# Patient Record
Sex: Male | Born: 1970 | Race: White | Hispanic: No | Marital: Married | Smoking: Former smoker
Health system: Southern US, Community
[De-identification: ages and names within clinical notes are randomized; demographics above are authoritative.]

## PROBLEM LIST (undated history)

## (undated) DIAGNOSIS — R002 Palpitations: Secondary | ICD-10-CM

## (undated) DIAGNOSIS — K219 Gastro-esophageal reflux disease without esophagitis: Secondary | ICD-10-CM

## (undated) DIAGNOSIS — Z9989 Dependence on other enabling machines and devices: Secondary | ICD-10-CM

## (undated) DIAGNOSIS — G4733 Obstructive sleep apnea (adult) (pediatric): Secondary | ICD-10-CM

## (undated) DIAGNOSIS — L729 Follicular cyst of the skin and subcutaneous tissue, unspecified: Secondary | ICD-10-CM

## (undated) DIAGNOSIS — F419 Anxiety disorder, unspecified: Secondary | ICD-10-CM

## (undated) DIAGNOSIS — D179 Benign lipomatous neoplasm, unspecified: Secondary | ICD-10-CM

## (undated) HISTORY — DX: Benign lipomatous neoplasm, unspecified: D17.9

## (undated) HISTORY — PX: LIPOMA EXCISION: SHX5283

## (undated) HISTORY — DX: Follicular cyst of the skin and subcutaneous tissue, unspecified: L72.9

## (undated) HISTORY — PX: HERNIA REPAIR: SHX51

## (undated) HISTORY — DX: Gastro-esophageal reflux disease without esophagitis: K21.9

## (undated) HISTORY — DX: Anxiety disorder, unspecified: F41.9

## (undated) HISTORY — DX: Dependence on other enabling machines and devices: Z99.89

## (undated) HISTORY — DX: Obstructive sleep apnea (adult) (pediatric): G47.33

## (undated) HISTORY — DX: Palpitations: R00.2

---

## 1998-10-16 ENCOUNTER — Ambulatory Visit (HOSPITAL_COMMUNITY): Admission: RE | Admit: 1998-10-16 | Discharge: 1998-10-16 | Payer: Self-pay | Admitting: Internal Medicine

## 1998-10-16 ENCOUNTER — Encounter: Payer: Self-pay | Admitting: Internal Medicine

## 2004-04-04 ENCOUNTER — Ambulatory Visit (HOSPITAL_BASED_OUTPATIENT_CLINIC_OR_DEPARTMENT_OTHER): Admission: RE | Admit: 2004-04-04 | Discharge: 2004-04-04 | Payer: Self-pay | Admitting: General Surgery

## 2004-04-04 ENCOUNTER — Ambulatory Visit (HOSPITAL_COMMUNITY): Admission: RE | Admit: 2004-04-04 | Discharge: 2004-04-04 | Payer: Self-pay | Admitting: General Surgery

## 2004-04-04 ENCOUNTER — Encounter (INDEPENDENT_AMBULATORY_CARE_PROVIDER_SITE_OTHER): Payer: Self-pay | Admitting: *Deleted

## 2009-01-27 HISTORY — PX: APPENDECTOMY: SHX54

## 2009-04-06 ENCOUNTER — Encounter: Admission: RE | Admit: 2009-04-06 | Discharge: 2009-04-06 | Payer: Self-pay | Admitting: Gastroenterology

## 2009-04-12 ENCOUNTER — Ambulatory Visit (HOSPITAL_COMMUNITY): Admission: RE | Admit: 2009-04-12 | Discharge: 2009-04-12 | Payer: Self-pay | Admitting: Gastroenterology

## 2009-08-20 ENCOUNTER — Ambulatory Visit (HOSPITAL_COMMUNITY): Admission: AD | Admit: 2009-08-20 | Discharge: 2009-08-21 | Payer: Self-pay | Admitting: Internal Medicine

## 2009-08-20 ENCOUNTER — Encounter (INDEPENDENT_AMBULATORY_CARE_PROVIDER_SITE_OTHER): Payer: Self-pay | Admitting: General Surgery

## 2009-08-20 ENCOUNTER — Encounter: Admission: RE | Admit: 2009-08-20 | Discharge: 2009-08-20 | Payer: Self-pay | Admitting: Internal Medicine

## 2010-04-25 ENCOUNTER — Ambulatory Visit
Admission: RE | Admit: 2010-04-25 | Discharge: 2010-04-25 | Disposition: A | Discharge status: Home / Self Care | Payer: BC Managed Care – PPO | Source: Ambulatory Visit | Attending: Internal Medicine | Admitting: Internal Medicine

## 2010-04-25 ENCOUNTER — Other Ambulatory Visit: Payer: Self-pay | Admitting: Internal Medicine

## 2010-04-25 DIAGNOSIS — N5089 Other specified disorders of the male genital organs: Secondary | ICD-10-CM

## 2010-06-14 NOTE — Op Note (Signed)
NAME:  Douglas Evans, Douglas Evans             ACCOUNT NO.:  [id number]   MEDICAL RECORD NO.:  [id number]          PATIENT TYPE:  AMB   LOCATION:  DSC                          FACILITY:  [facility]   PHYSICIAN:  Gabrielle Dare. [name], M.D.DATE OF BIRTH:  1971-01-13   DATE OF PROCEDURE:  04/04/2004  DATE OF DISCHARGE:                                 OPERATIVE REPORT   PREOPERATIVE DIAGNOSIS:  1.  Left inguinal hernia.  2.  Mass left forearm.   HISTORY OF PRESENT ILLNESS:  The patient is a 40 year old white male who had  been having pain and swelling in his left groin for a month or so.  He was  noted on physical exam for [employer] to have a hernia and he  was then seen by his primary care doctor and referred to me to evaluate both  his left inguinal hernia and a soft tissue mass in his left forearm.  The  patient presents today for elective repair of left inguinal hernia with mesh  and excision of a soft tissue mass in his left forearm.   PROCEDURE IN DETAIL:  Informed consent was obtained.  The patient was  identified. The sites were marked. He received intravenous antibiotics.  He  was brought to the operating room. General anesthesia was administered. His  left groin and lower abdomen and left arm were prepped and draped in sterile  fashion. Attention was first directed to his hernia repair. Marcaine 0.25%  with epinephrine was injected along the site of the planned left groin  incision.  Incision was made in the skin, subcutaneous tissues were  dissected down. Bovie cautery was used for good hemostasis. The external  oblique fascia was exposed and it was noted to be somewhat attenuated.  This  was divided and the division was carried down through the external ring. The  inferior leaflet was dissected bluntly off of the cord structures revealing  the shelving edge of inguinal ligament.  Superior leaflet was dissected off  the underlying transversalis away from the cord structures. The  ilioinguinal  nerve was protected.  Subsequently the cord structures were encircled with  Penrose drain and further dissection revealed this hernia to be a large  direct variety.  This was freed up from surrounding tissues and reduced  easily into the abdomen. The sac was not entered. The cord was then  dissected near the internal ring and there was no indirect hernia sac, only  a small cord lipoma there.  Subsequently the direct hernia sac was reduced  and two interrupted 2-0 Vicryl sutures were placed between the shelving edge  of inguinal ligament and transversalis to keep the direct hernia reduced  while the repair was completed with mesh. Subsequently the definitive hernia  repair was accomplished.  A polypropylene mesh was fashioned into a keyhole  using the template and this keyhole mesh was sutured medially to the tissues  over the pubic tubercle with 0 Prolene suture and it was then sutured in a  running fashion along the shelving edge of the inguinal ligament inferiorly  with 0 Prolene suture.  Subsequently the  superior edge of mesh was tacked  again to the tissues over the pubic tubercle with 0 Prolene suture and it  was sutured in an interrupted fashion with 0 Prolene superiorly along the  transversalis.  Once this was accomplished, the two  leaflets of the mesh  were rejoined to reconstruct the patient's ring behind the cord structures,  again 0 Prolene was used and these leaflets were rejoined and tacked to the  underlying musculature.  Some additional interrupted 3-0 Prolene sutures  were placed to tack the mesh down nicely. The aperture in the keyhole mesh  admitted the tip of the fifth digit along with the cord structures.  Cord  structures remained viable & nonedematous.  The area was copiously  irrigated. Meticulous hemostasis was obtained. The external oblique fascia  was then closed with running 3-0 Vicryl suture. Some additional local  anesthetic was injected,  including injection out towards the superior iliac  spine for postoperative pain relief and then Scarpa's fascia was  reapproximated with a series of interrupted 3-0 Vicryl sutures. The  subcutaneous tissues were  again irrigated. The skin was closed with a  running 4-0 Monocryl subcuticular stitch. Sponge, needle and instrument  counts were correct. Benzoin, Steri-Strips and sterile dressing were  applied. Attention was then directed to the patient's left forearm.  Some  0.25% plain Marcaine was injected for local anesthetic. A longitudinal  incision was made along the length of this 2 cm mass.  Subcutaneous tissues  were dissected down revealing a well encapsulated mass. This was removed in  one piece using Bovie cautery circumferentially.  There was an additional  globular piece of fat next to this mass which was also sent with specimen.  This was very small.  The tissues were then irrigated.  Subcutaneous tissues  were reapproximated with a series of interrupted 3-0 Vicryl sutures and the  skin was closed with running 4-0 Monocryl subcuticular stitch. After  meticulous hemostasis was obtained, Benzoin, Steri-Strips and sterile  dressing were applied. Sponge, needle and instrument counts were again  correct. The patient tolerated the procedure well and was taken to the  recovery room confirm stable condition.  There were no apparent  complications.      BET/MEDQ  D:  04/04/2004  T:  04/04/2004  Job:  [id number]

## 2014-06-21 ENCOUNTER — Encounter: Payer: Self-pay | Admitting: *Deleted

## 2014-09-14 ENCOUNTER — Encounter (HOSPITAL_COMMUNITY): Payer: Self-pay | Admitting: *Deleted

## 2014-09-14 ENCOUNTER — Other Ambulatory Visit: Payer: Self-pay | Admitting: Gastroenterology

## 2014-09-15 ENCOUNTER — Encounter (HOSPITAL_COMMUNITY): Payer: Self-pay | Admitting: *Deleted

## 2014-09-18 ENCOUNTER — Ambulatory Visit (HOSPITAL_COMMUNITY): Payer: BLUE CROSS/BLUE SHIELD | Admitting: Anesthesiology

## 2014-09-18 ENCOUNTER — Encounter (HOSPITAL_COMMUNITY): Payer: Self-pay

## 2014-09-18 ENCOUNTER — Ambulatory Visit (HOSPITAL_COMMUNITY)
Admission: RE | Admit: 2014-09-18 | Discharge: 2014-09-18 | Disposition: A | Discharge status: Home / Self Care | Payer: BLUE CROSS/BLUE SHIELD | Source: Ambulatory Visit | Attending: Gastroenterology | Admitting: Gastroenterology

## 2014-09-18 ENCOUNTER — Encounter (HOSPITAL_COMMUNITY)
Admission: RE | Disposition: A | Discharge status: Home / Self Care | Payer: Self-pay | Source: Ambulatory Visit | Attending: Gastroenterology

## 2014-09-18 DIAGNOSIS — R109 Unspecified abdominal pain: Secondary | ICD-10-CM | POA: Diagnosis not present

## 2014-09-18 DIAGNOSIS — K222 Esophageal obstruction: Secondary | ICD-10-CM | POA: Diagnosis not present

## 2014-09-18 DIAGNOSIS — K269 Duodenal ulcer, unspecified as acute or chronic, without hemorrhage or perforation: Secondary | ICD-10-CM | POA: Insufficient documentation

## 2014-09-18 DIAGNOSIS — G4733 Obstructive sleep apnea (adult) (pediatric): Secondary | ICD-10-CM | POA: Insufficient documentation

## 2014-09-18 DIAGNOSIS — R1310 Dysphagia, unspecified: Secondary | ICD-10-CM | POA: Insufficient documentation

## 2014-09-18 DIAGNOSIS — Z79899 Other long term (current) drug therapy: Secondary | ICD-10-CM | POA: Diagnosis not present

## 2014-09-18 DIAGNOSIS — K449 Diaphragmatic hernia without obstruction or gangrene: Secondary | ICD-10-CM | POA: Diagnosis not present

## 2014-09-18 DIAGNOSIS — K219 Gastro-esophageal reflux disease without esophagitis: Secondary | ICD-10-CM | POA: Insufficient documentation

## 2014-09-18 DIAGNOSIS — G4730 Sleep apnea, unspecified: Secondary | ICD-10-CM | POA: Diagnosis not present

## 2014-09-18 DIAGNOSIS — R197 Diarrhea, unspecified: Secondary | ICD-10-CM | POA: Diagnosis not present

## 2014-09-18 DIAGNOSIS — F419 Anxiety disorder, unspecified: Secondary | ICD-10-CM | POA: Diagnosis not present

## 2014-09-18 HISTORY — PX: BALLOON DILATION: SHX5330

## 2014-09-18 HISTORY — PX: COLONOSCOPY WITH PROPOFOL: SHX5780

## 2014-09-18 HISTORY — PX: ESOPHAGOGASTRODUODENOSCOPY (EGD) WITH PROPOFOL: SHX5813

## 2014-09-18 SURGERY — ESOPHAGOGASTRODUODENOSCOPY (EGD) WITH PROPOFOL
Anesthesia: Monitor Anesthesia Care

## 2014-09-18 MED ORDER — PROPOFOL 10 MG/ML IV BOLUS
INTRAVENOUS | Status: AC
  Filled 2014-09-18: qty 20

## 2014-09-18 MED ORDER — BUTAMBEN-TETRACAINE-BENZOCAINE 2-2-14 % EX AERO
INHALATION_SPRAY | CUTANEOUS | Status: DC | PRN
  Administered 2014-09-18: 1 via TOPICAL

## 2014-09-18 MED ORDER — LACTATED RINGERS IV SOLN
INTRAVENOUS | Status: DC
  Administered 2014-09-18: 1000 mL via INTRAVENOUS

## 2014-09-18 MED ORDER — SODIUM CHLORIDE 0.9 % IV SOLN: INTRAVENOUS | Status: DC

## 2014-09-18 MED ORDER — PROPOFOL 10 MG/ML IV BOLUS
INTRAVENOUS | Status: DC | PRN
  Administered 2014-09-18 (×2): 10 mg via INTRAVENOUS
  Administered 2014-09-18: 20 mg via INTRAVENOUS
  Administered 2014-09-18 (×3): 10 mg via INTRAVENOUS
  Administered 2014-09-18: 20 mg via INTRAVENOUS
  Administered 2014-09-18 (×4): 10 mg via INTRAVENOUS
  Administered 2014-09-18: 30 mg via INTRAVENOUS
  Administered 2014-09-18: 20 mg via INTRAVENOUS
  Administered 2014-09-18 (×5): 10 mg via INTRAVENOUS
  Administered 2014-09-18: 40 mg via INTRAVENOUS
  Administered 2014-09-18 (×2): 20 mg via INTRAVENOUS
  Administered 2014-09-18 (×4): 10 mg via INTRAVENOUS

## 2014-09-18 SURGICAL SUPPLY — 25 items

## 2014-09-18 NOTE — Discharge Instructions (Signed)
Colonoscopy, Care After °These instructions give you information on caring for yourself after your procedure. Your doctor may also give you more specific instructions. Call your doctor if you have any problems or questions after your procedure. °HOME CARE °· Do not drive for 24 hours. °· Do not sign important papers or use machinery for 24 hours. °· You may shower. °· You may go back to your usual activities, but go slower for the first 24 hours. °· Take rest breaks often during the first 24 hours. °· Walk around or use warm packs on your belly (abdomen) if you have belly cramping or gas. °· Drink enough fluids to keep your pee (urine) clear or pale yellow. °· Resume your normal diet. Avoid heavy or fried foods. °· Avoid drinking alcohol for 24 hours or as told by your doctor. °· Only take medicines as told by your doctor. °If a tissue sample (biopsy) was taken during the procedure:  °· Do not take aspirin or blood thinners for 7 days, or as told by your doctor. °· Do not drink alcohol for 7 days, or as told by your doctor. °· Eat soft foods for the first 24 hours. °GET HELP IF: °You still have a small amount of blood in your poop (stool) 2-3 days after the procedure. °GET HELP RIGHT AWAY IF: °· You have more than a small amount of blood in your poop. °· You see clumps of tissue (blood clots) in your poop. °· Your belly is puffy (swollen). °· You feel sick to your stomach (nauseous) or throw up (vomit). °· You have a fever. °· You have belly pain that gets worse and medicine does not help. °MAKE SURE YOU: °· Understand these instructions. °· Will watch your condition. °· Will get help right away if you are not doing well or get worse. °Document Released: 02/15/2010 Document Revised: 01/18/2013 Document Reviewed: 09/20/2012 °[employer]® Patient Information ©2015 [employer]. This information is not intended to replace advice given to you by your health care provider. Make sure you discuss any questions you have with  your health care provider. °Esophagogastroduodenoscopy °Care After °Refer to this sheet in the next few weeks. These instructions provide you with information on caring for yourself after your procedure. Your caregiver may also give you more specific instructions. Your treatment has been planned according to current medical practices, but problems sometimes occur. Call your caregiver if you have any problems or questions after your procedure.  °HOME CARE INSTRUCTIONS °· Do not eat or drink anything until the numbing medicine (local anesthetic) has worn off and your gag reflex has returned. You will know that the local anesthetic has worn off when you can swallow comfortably. °· Do not drive for 12 hours after the procedure or as directed by your caregiver. °· Only take medicines as directed by your caregiver. °SEEK MEDICAL CARE IF:  °· You cannot stop coughing. °· You are not urinating at all or less than usual. °SEEK IMMEDIATE MEDICAL CARE IF: °· You have difficulty swallowing. °· You cannot eat or drink. °· You have worsening throat or chest pain. °· You have dizziness, lightheadedness, or you faint. °· You have nausea or vomiting. °· You have chills. °· You have a fever. °· You have severe abdominal pain. °· You have black, tarry, or bloody stools. °Document Released: 12/31/2011 Document Reviewed: 12/31/2011 °[employer]® Patient Information ©2015 [employer]. This information is not intended to replace advice given to you by your health care provider. Make sure you   discuss any questions you have with your health care provider. ° °

## 2014-09-18 NOTE — Anesthesia Postprocedure Evaluation (Signed)
  Anesthesia Post-op Note  Patient: Douglas Evans [facility]  Procedure(s) Performed: Procedure(s) (LRB): ESOPHAGOGASTRODUODENOSCOPY (EGD) WITH PROPOFOL (N/A) COLONOSCOPY WITH PROPOFOL (N/A) BALLOON DILATION (N/A)  Patient Location: PACU  Anesthesia Type: MAC  Level of Consciousness: awake and alert   Airway and Oxygen Therapy: Patient Spontanous Breathing  Post-op Pain: mild  Post-op Assessment: Post-op Vital signs reviewed, Patient's Cardiovascular Status Stable, Respiratory Function Stable, Patent Airway and No signs of Nausea or vomiting  Last Vitals:  Filed Vitals:   09/18/14 0820  BP: 106/79  Pulse: 62  Temp:   Resp: 13    Post-op Vital Signs: stable   Complications: No apparent anesthesia complications

## 2014-09-18 NOTE — H&P (Signed)
  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 Chronic postprandial watery diarrhea  History: The patient is a 44 year old male born 1971/01/10. 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 He has redeveloped esophageal dysphagia. He is taking omeprazole each morning.  His daughter was recently diagnosed with Crohn's disease. The patient takes Lexapro on a daily basis. He has chronic postprandial abdominal cramps leading to explosive watery, nonbloody diarrhea unassociated with anorexia, weight loss, or nocturnal diarrhea.  Differential diagnosis would include microscopic colitis associated with SSRI , inflammatory bowel disease, irritable bowel syndrome, and celiac disease.  He is scheduled to undergo diagnostic esophagogastroduodenoscopy with benign esophageal stricture dilation followed by diagnostic colonoscopy.  Past medical history: Chronic anxiety. Obstructive sleep apnea syndrome. 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 Appendectomy. Lipoma removal from the left arm. Left inguinal hernia surgery.  Medication allergies: Penicillin  Exam: The patient is alert and lying comfortably on the endoscopy stretcher. Abdomen is soft and nontender to palpation. Cardiac exam reveals a regular rhythm. Lungs are clear to auscultation.  Plan: Proceed with diagnostic esophagogastroduodenoscopy, benign esophageal stricture dilation, and diagnostic colonoscopy

## 2014-09-18 NOTE — Transfer of Care (Signed)
Immediate Anesthesia Transfer of Care Note  Patient: Douglas Evans [facility]  Procedure(s) Performed: Procedure(s) with comments: ESOPHAGOGASTRODUODENOSCOPY (EGD) WITH PROPOFOL (N/A) - With Savory Dilation with Fluoro COLONOSCOPY WITH PROPOFOL (N/A) BALLOON DILATION (N/A)  Patient Location: PACU  Anesthesia Type:MAC  Level of Consciousness: sedated  Airway & Oxygen Therapy: Patient Spontanous Breathing and Patient connected to nasal cannula oxygen  Post-op Assessment: Report given to RN and Post -op Vital signs reviewed and stable  Post vital signs: Reviewed and stable  Last Vitals:  Filed Vitals:   09/18/14 0631  BP: 132/82  Pulse: 61  Temp: 36.7 C  Resp: 19    Complications: No apparent anesthesia complications

## 2014-09-18 NOTE — Op Note (Signed)
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 Chronic postprandial abdominal cramps leading to explosive watery, nonbloody diarrhea unassociated with anorexia, weight loss, or nocturnal diarrhea.  Endoscopist: Earle Gell  Premedication: Propofol administered by anesthesia  Procedure: Diagnostic esophagogastroduodenoscopy with small bowel biopsies The patient was placed in the left lateral decubitus position. The Pentax gastroscope was passed through the posterior hypopharynx into the proximal esophagus without difficulty. The hypopharynx, larynx, and vocal cords appeared normal.  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 The squamocolumnar junction was noted at 40 cm from the incisor teeth.  Gastroscopy: There was a small hiatal hernia. Retroflexed view of the gastric cardia and fundus was normal. The diaphragmatic hiatus was patulous. The gastric body, antrum, and pylorus appeared normal.  Duodenoscopy: Scattered small erosions were present in the distal duodenal bulb and proximal second portion of duodenum. 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 Scattered small erosions were present in the distal duodenal bulb and proximal second portion of the duodenum. Small bowel biopsies were  performed to look for villous atrophy associated with celiac disease  Recommendation: Take omeprazole 20 mg before breakfast each morning to prevent acid gastroesophageal reflux  Procedure: Diagnostic colonoscopy Anal inspection and digital rectal exam were normal. The Pentax pediatric colonoscope was introduced into the rectum and advanced to the cecum. A normal-appearing appendiceal orifice was identified. A normal-appearing ileocecal valve was intubated and the terminal ileum inspected. Colonic preparation for the exam today was good. Withdrawal time was 9 minutes  Rectum. Normal. Retroflexed view of the distal rectum was normal  Avoid colon and descending colon. Normal  Splenic flexure. Normal  Transverse colon. Normal  Hepatic flexure. Normal  Ascending colon. Normal  Cecum and ileocecal valve. Normal  Terminal ileum. Normal  Random colon biopsies. Biopsies were performed from the right colon and left colon to look for microscopic colitis  Assessment: Normal diagnostic colonoscopy. Small bowel biopsies to rule out microscopic colitis pending.

## 2014-09-18 NOTE — Anesthesia Preprocedure Evaluation (Addendum)
Anesthesia Evaluation  Patient identified by MRN, date of birth, ID band Patient awake    Reviewed: Allergy & Precautions, NPO status , Patient's Chart, lab work & pertinent test results  Airway Mallampati: II  TM Distance: >3 FB Neck ROM: Full    Dental no notable dental hx.    Pulmonary neg pulmonary ROS, sleep apnea and Continuous Positive Airway Pressure Ventilation ,  breath sounds clear to auscultation  Pulmonary exam normal       Cardiovascular negative cardio ROS Normal cardiovascular examRhythm:Regular Rate:Normal     Neuro/Psych negative neurological ROS  negative psych ROS   GI/Hepatic negative GI ROS, Neg liver ROS,   Endo/Other  negative endocrine ROS  Renal/GU negative Renal ROS  negative genitourinary   Musculoskeletal negative musculoskeletal ROS (+)   Abdominal   Peds negative pediatric ROS (+)  Hematology negative hematology ROS (+)   Anesthesia Other Findings   Reproductive/Obstetrics negative OB ROS                            Anesthesia Physical Anesthesia Plan  ASA: II  Anesthesia Plan: MAC   Post-op Pain Management:    Induction: Intravenous  Airway Management Planned:   Additional Equipment:   Intra-op Plan:   Post-operative Plan: Extubation in OR  Informed Consent: I have reviewed the patients History and Physical, chart, labs and discussed the procedure including the risks, benefits and alternatives for the proposed anesthesia with the patient or authorized representative who has indicated his/her understanding and acceptance.   Dental advisory given  Plan Discussed with: CRNA  Anesthesia Plan Comments:         Anesthesia Quick Evaluation

## 2014-09-19 ENCOUNTER — Encounter (HOSPITAL_COMMUNITY): Payer: Self-pay | Admitting: Gastroenterology

## 2016-06-30 ENCOUNTER — Encounter: Payer: Self-pay | Admitting: *Deleted

## 2016-06-30 ENCOUNTER — Emergency Department (INDEPENDENT_AMBULATORY_CARE_PROVIDER_SITE_OTHER): Payer: Self-pay

## 2016-06-30 ENCOUNTER — Emergency Department (INDEPENDENT_AMBULATORY_CARE_PROVIDER_SITE_OTHER)
Admission: EM | Admit: 2016-06-30 | Discharge: 2016-06-30 | Disposition: A | Discharge status: Home / Self Care | Payer: Self-pay | Source: Home / Self Care | Attending: Family Medicine | Admitting: Family Medicine

## 2016-06-30 DIAGNOSIS — R51 Headache: Secondary | ICD-10-CM

## 2016-06-30 DIAGNOSIS — M542 Cervicalgia: Secondary | ICD-10-CM

## 2016-06-30 DIAGNOSIS — S161XXA Strain of muscle, fascia and tendon at neck level, initial encounter: Secondary | ICD-10-CM

## 2016-06-30 MED ORDER — CYCLOBENZAPRINE HCL 10 MG PO TABS: 10.0000 mg | ORAL_TABLET | Freq: Three times a day (TID) | ORAL | 0 refills | Status: AC
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
## 2016-06-30 NOTE — Discharge Instructions (Signed)
Apply ice pack for 20 to 30 minutes, 3 to 4 times daily  Continue until pain and swelling decrease.  May take Ibuprofen 200mg , 4 tabs every 8 hours with food.  Wear cervical collar until neck pain and spasm resolve.  Begin neck exercises as tolerated.

## 2018-03-18 DIAGNOSIS — R0981 Nasal congestion: Secondary | ICD-10-CM | POA: Diagnosis not present

## 2018-03-18 DIAGNOSIS — J069 Acute upper respiratory infection, unspecified: Secondary | ICD-10-CM | POA: Diagnosis not present
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
## 2018-08-27 DIAGNOSIS — R109 Unspecified abdominal pain: Secondary | ICD-10-CM | POA: Diagnosis not present

## 2018-08-27 DIAGNOSIS — R195 Other fecal abnormalities: Secondary | ICD-10-CM | POA: Diagnosis not present

## 2018-10-18 ENCOUNTER — Other Ambulatory Visit: Payer: Self-pay | Admitting: Internal Medicine

## 2018-10-18 ENCOUNTER — Ambulatory Visit
Admission: RE | Admit: 2018-10-18 | Discharge: 2018-10-18 | Disposition: A | Discharge status: Home / Self Care | Payer: BC Managed Care – PPO | Source: Ambulatory Visit | Attending: Internal Medicine | Admitting: Internal Medicine

## 2018-10-18 DIAGNOSIS — M542 Cervicalgia: Secondary | ICD-10-CM

## 2018-10-18 DIAGNOSIS — Z125 Encounter for screening for malignant neoplasm of prostate: Secondary | ICD-10-CM | POA: Diagnosis not present

## 2018-10-18 DIAGNOSIS — Z1389 Encounter for screening for other disorder: Secondary | ICD-10-CM | POA: Diagnosis not present

## 2018-10-18 DIAGNOSIS — Z0000 Encounter for general adult medical examination without abnormal findings: Secondary | ICD-10-CM | POA: Diagnosis not present

## 2018-10-18 DIAGNOSIS — Z23 Encounter for immunization: Secondary | ICD-10-CM | POA: Diagnosis not present

## 2018-10-18 DIAGNOSIS — K219 Gastro-esophageal reflux disease without esophagitis: Secondary | ICD-10-CM | POA: Diagnosis not present

## 2018-10-18 DIAGNOSIS — G4733 Obstructive sleep apnea (adult) (pediatric): Secondary | ICD-10-CM | POA: Diagnosis not present

## 2018-10-18 DIAGNOSIS — Z13220 Encounter for screening for lipoid disorders: Secondary | ICD-10-CM | POA: Diagnosis not present

## 2018-10-18 DIAGNOSIS — Z131 Encounter for screening for diabetes mellitus: Secondary | ICD-10-CM | POA: Diagnosis not present

## 2019-01-25 DIAGNOSIS — G4733 Obstructive sleep apnea (adult) (pediatric): Secondary | ICD-10-CM | POA: Diagnosis not present

## 2019-02-14 DIAGNOSIS — G4733 Obstructive sleep apnea (adult) (pediatric): Secondary | ICD-10-CM | POA: Diagnosis not present

## 2019-04-27 DIAGNOSIS — L259 Unspecified contact dermatitis, unspecified cause: Secondary | ICD-10-CM | POA: Diagnosis not present

## 2019-04-27 DIAGNOSIS — L237 Allergic contact dermatitis due to plants, except food: Secondary | ICD-10-CM | POA: Diagnosis not present

## 2019-06-15 DIAGNOSIS — R0981 Nasal congestion: Secondary | ICD-10-CM | POA: Diagnosis not present

## 2019-06-15 DIAGNOSIS — J309 Allergic rhinitis, unspecified: Secondary | ICD-10-CM | POA: Diagnosis not present

## 2019-07-27 DIAGNOSIS — M545 Low back pain: Secondary | ICD-10-CM | POA: Diagnosis not present

## 2019-10-24 DIAGNOSIS — Z0000 Encounter for general adult medical examination without abnormal findings: Secondary | ICD-10-CM | POA: Diagnosis not present

## 2019-10-24 DIAGNOSIS — R7303 Prediabetes: Secondary | ICD-10-CM | POA: Diagnosis not present

## 2019-10-24 DIAGNOSIS — Z23 Encounter for immunization: Secondary | ICD-10-CM | POA: Diagnosis not present

## 2019-10-24 DIAGNOSIS — Z13220 Encounter for screening for lipoid disorders: Secondary | ICD-10-CM | POA: Diagnosis not present

## 2019-10-24 DIAGNOSIS — J309 Allergic rhinitis, unspecified: Secondary | ICD-10-CM | POA: Diagnosis not present

## 2019-10-24 DIAGNOSIS — G4733 Obstructive sleep apnea (adult) (pediatric): Secondary | ICD-10-CM | POA: Diagnosis not present

## 2019-10-24 DIAGNOSIS — N200 Calculus of kidney: Secondary | ICD-10-CM | POA: Diagnosis not present

## 2020-01-09 DIAGNOSIS — Z20822 Contact with and (suspected) exposure to covid-19: Secondary | ICD-10-CM | POA: Diagnosis not present

## 2020-01-09 DIAGNOSIS — R197 Diarrhea, unspecified: Secondary | ICD-10-CM | POA: Diagnosis not present

## 2020-01-16 DIAGNOSIS — Z20822 Contact with and (suspected) exposure to covid-19: Secondary | ICD-10-CM | POA: Diagnosis not present

## 2020-04-09 DIAGNOSIS — R197 Diarrhea, unspecified: Secondary | ICD-10-CM | POA: Diagnosis not present

## 2020-07-05 DIAGNOSIS — K219 Gastro-esophageal reflux disease without esophagitis: Secondary | ICD-10-CM | POA: Diagnosis not present

## 2020-07-05 DIAGNOSIS — R0789 Other chest pain: Secondary | ICD-10-CM | POA: Diagnosis not present

## 2020-07-05 DIAGNOSIS — Z79899 Other long term (current) drug therapy: Secondary | ICD-10-CM | POA: Diagnosis not present

## 2020-07-05 DIAGNOSIS — Z880 Allergy status to penicillin: Secondary | ICD-10-CM | POA: Diagnosis not present

## 2020-07-05 DIAGNOSIS — R079 Chest pain, unspecified: Secondary | ICD-10-CM | POA: Diagnosis not present

## 2020-07-05 DIAGNOSIS — R9431 Abnormal electrocardiogram [ECG] [EKG]: Secondary | ICD-10-CM | POA: Diagnosis not present

## 2020-10-23 IMAGING — DX DG CERVICAL SPINE 2 OR 3 VIEWS
4 series · 4 of 4 positions shown · non-contrast
Comparison: June 30, 2016

CLINICAL DATA: Cervicalgia

EXAM:
CERVICAL SPINE - 2-3 VIEW

[dg cervical spine 2 or 3 views (1 of 4)]
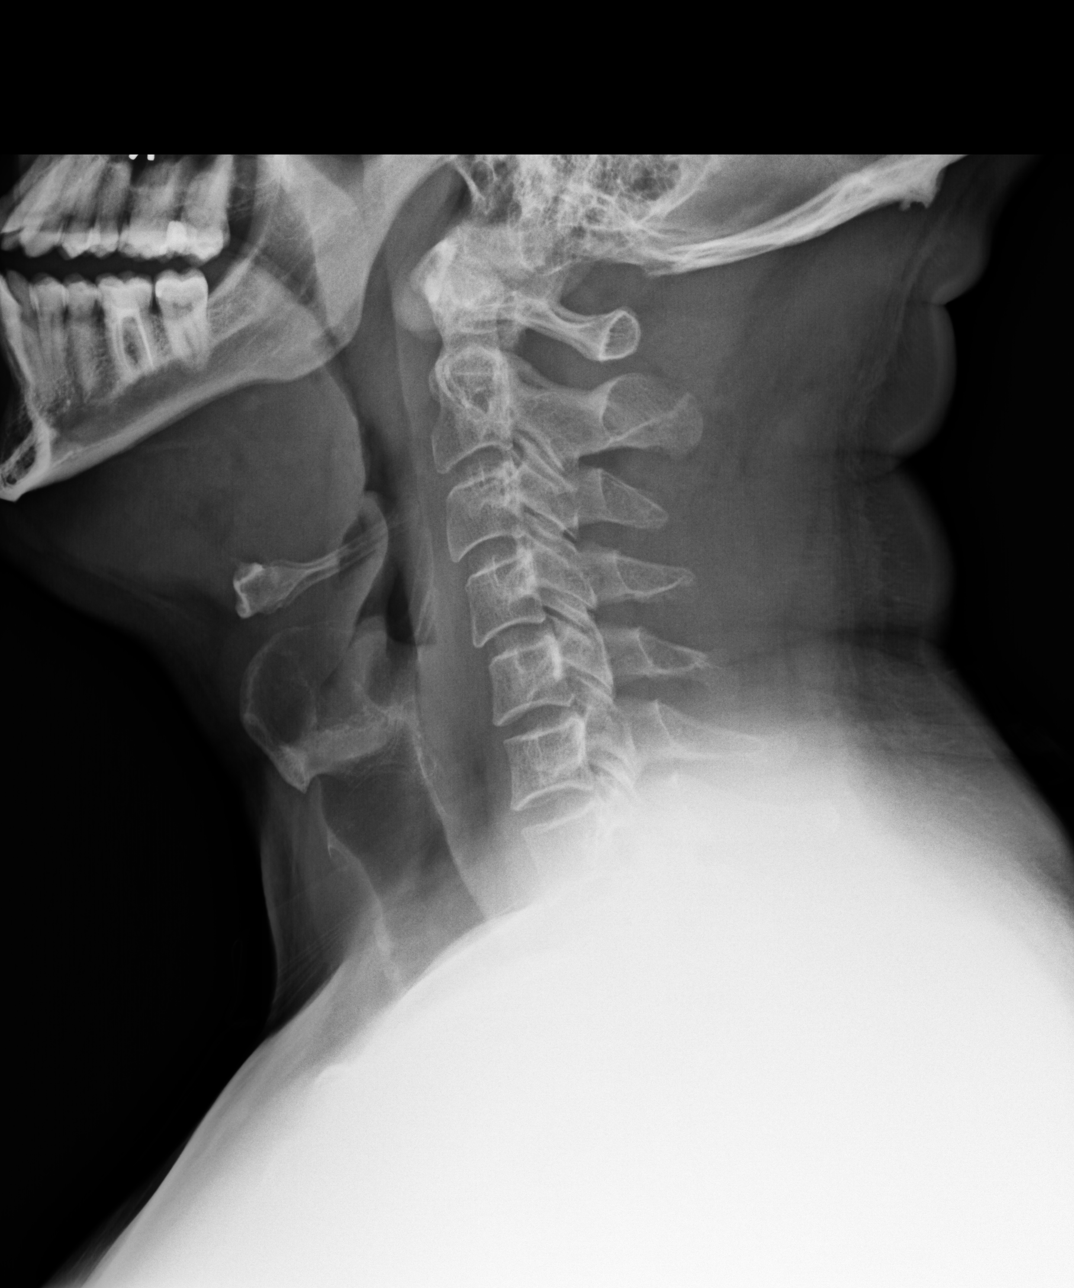

[dg cervical spine 2 or 3 views (2 of 4)]
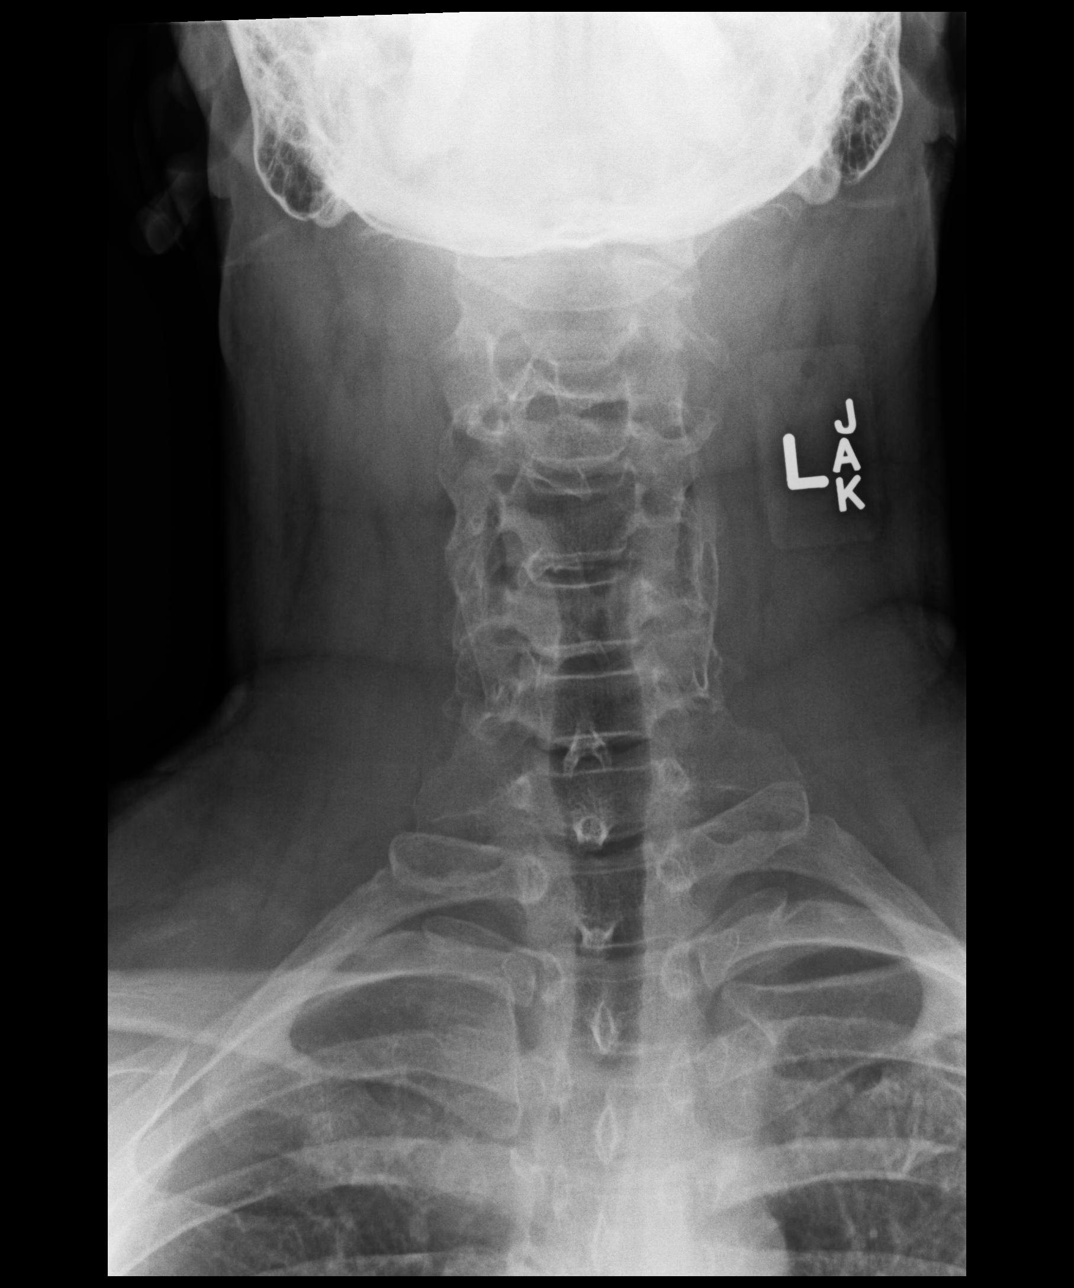

[dg cervical spine 2 or 3 views (3 of 4)]
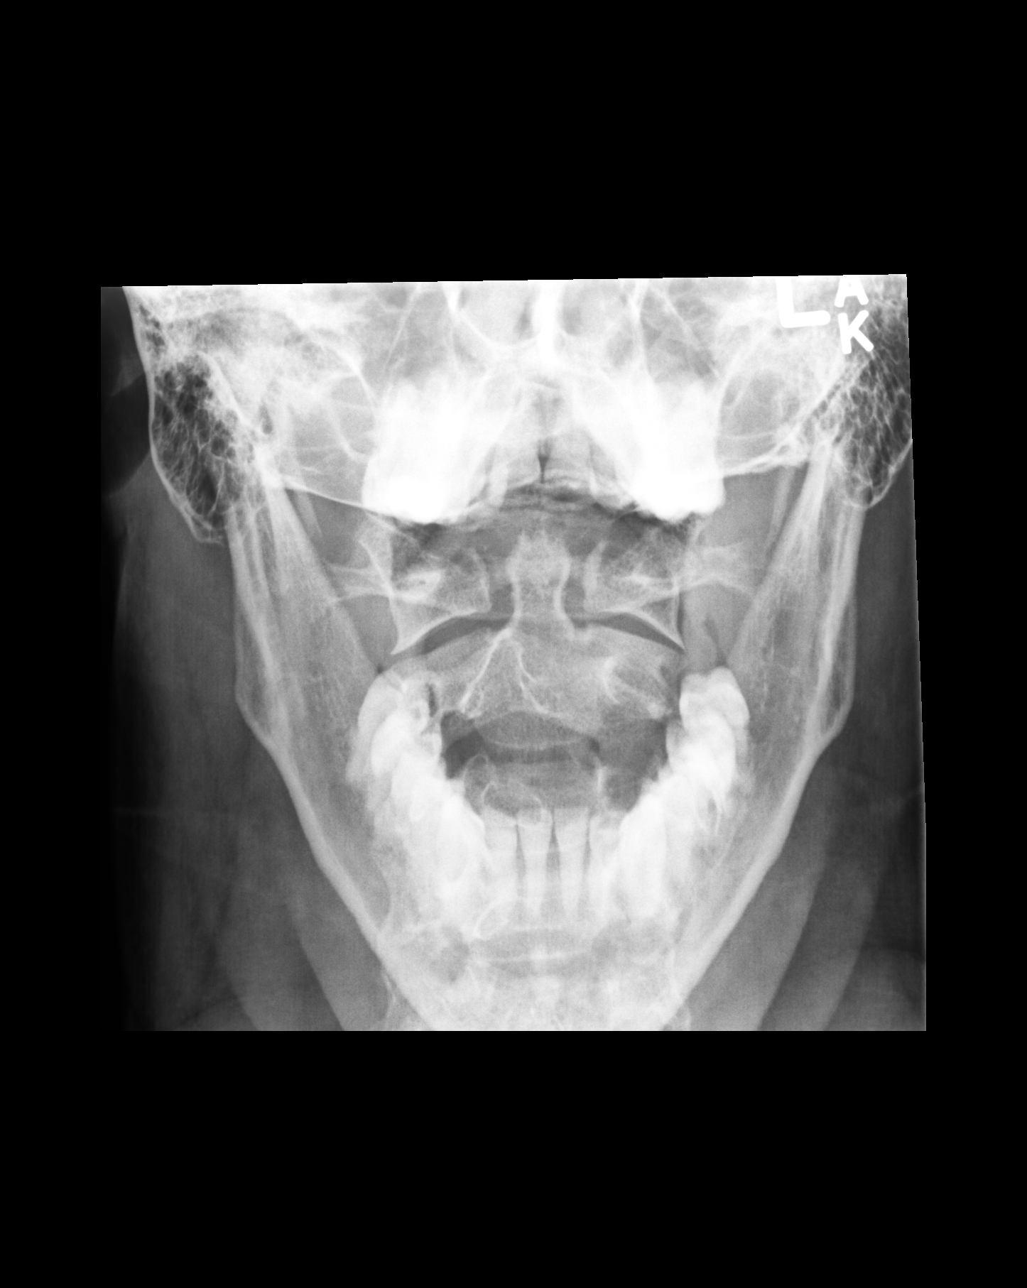

[dg cervical spine 2 or 3 views (4 of 4)]
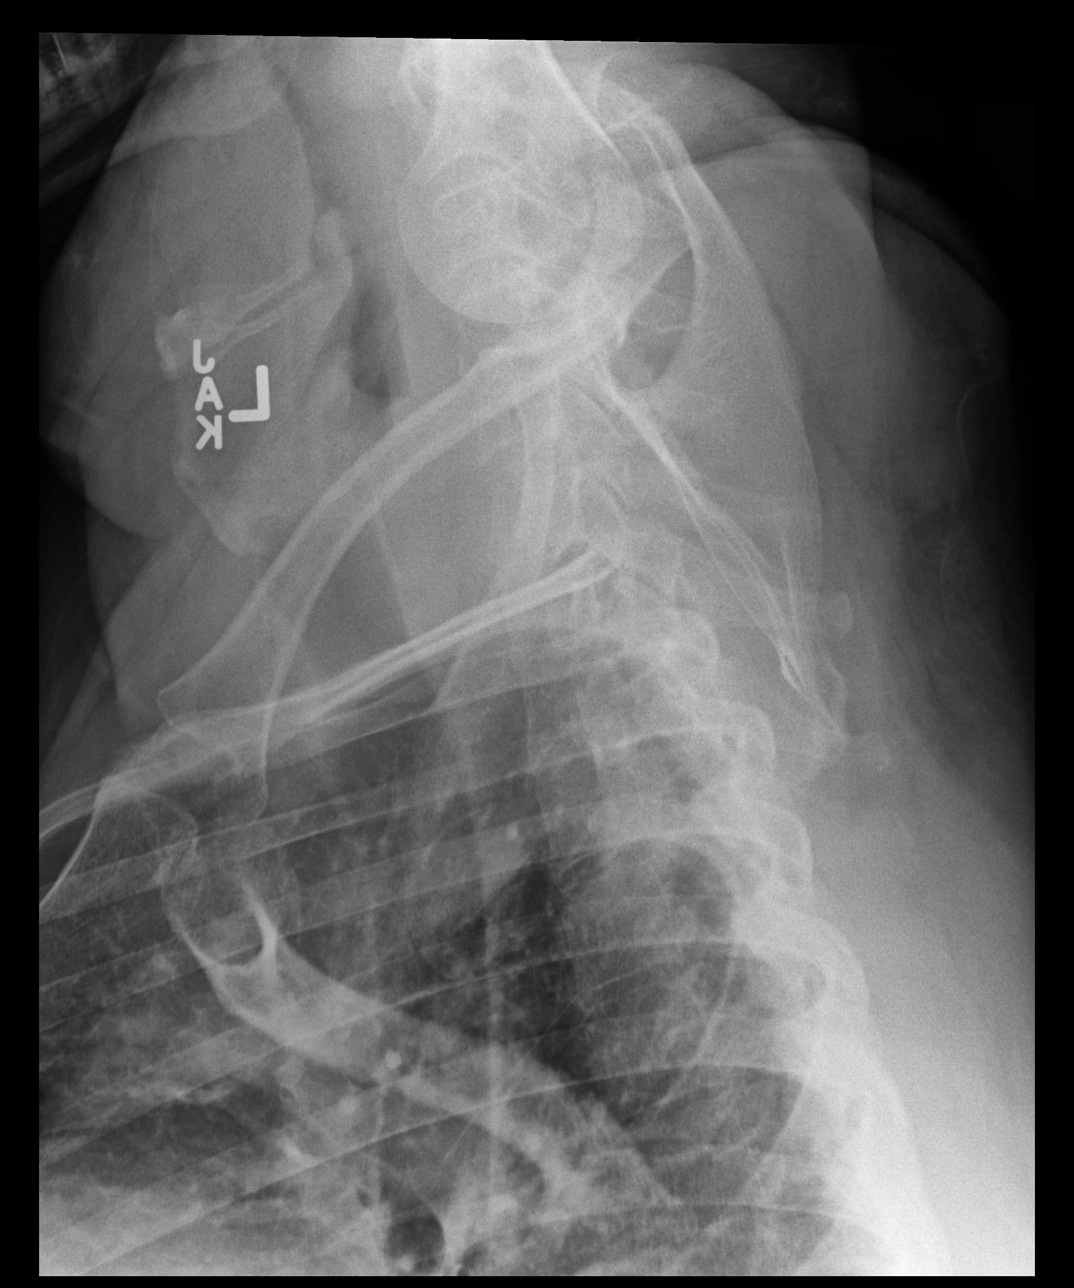

[4 of 4 positions shown; findings below may reference images not displayed]

FINDINGS: Frontal, lateral, and open-mouth odontoid images were obtained.
There is no fracture or spondylolisthesis. Prevertebral soft tissues
and predental space regions are normal. Disc spaces appear
unremarkable. No erosive change. There is mild reversal of lordotic
curvature. Lung apices are clear.
IMPRESSION: Mild reversal of lordotic curvature likely is indicative of a degree
of muscle spasm. No fracture or spondylolisthesis. No appreciable
arthropathy.

## 2020-10-26 DIAGNOSIS — Z0000 Encounter for general adult medical examination without abnormal findings: Secondary | ICD-10-CM | POA: Diagnosis not present

## 2020-10-26 DIAGNOSIS — R7303 Prediabetes: Secondary | ICD-10-CM | POA: Diagnosis not present

## 2020-10-26 DIAGNOSIS — Z125 Encounter for screening for malignant neoplasm of prostate: Secondary | ICD-10-CM | POA: Diagnosis not present

## 2020-10-26 DIAGNOSIS — N400 Enlarged prostate without lower urinary tract symptoms: Secondary | ICD-10-CM | POA: Diagnosis not present

## 2020-10-26 DIAGNOSIS — Z23 Encounter for immunization: Secondary | ICD-10-CM | POA: Diagnosis not present

## 2020-10-26 DIAGNOSIS — Z13220 Encounter for screening for lipoid disorders: Secondary | ICD-10-CM | POA: Diagnosis not present

## 2020-11-28 DIAGNOSIS — G4733 Obstructive sleep apnea (adult) (pediatric): Secondary | ICD-10-CM | POA: Diagnosis not present

## 2021-01-02 DIAGNOSIS — U071 COVID-19: Secondary | ICD-10-CM | POA: Diagnosis not present

## 2021-02-15 DIAGNOSIS — M542 Cervicalgia: Secondary | ICD-10-CM | POA: Diagnosis not present

## 2021-03-01 DIAGNOSIS — M47812 Spondylosis without myelopathy or radiculopathy, cervical region: Secondary | ICD-10-CM | POA: Diagnosis not present

## 2021-03-14 DIAGNOSIS — M47812 Spondylosis without myelopathy or radiculopathy, cervical region: Secondary | ICD-10-CM | POA: Diagnosis not present

## 2021-04-05 DIAGNOSIS — M47812 Spondylosis without myelopathy or radiculopathy, cervical region: Secondary | ICD-10-CM | POA: Diagnosis not present

## 2021-04-21 DIAGNOSIS — R112 Nausea with vomiting, unspecified: Secondary | ICD-10-CM | POA: Diagnosis not present

## 2021-04-21 DIAGNOSIS — R1084 Generalized abdominal pain: Secondary | ICD-10-CM | POA: Diagnosis not present

## 2021-04-21 DIAGNOSIS — K449 Diaphragmatic hernia without obstruction or gangrene: Secondary | ICD-10-CM | POA: Diagnosis not present

## 2021-04-21 DIAGNOSIS — N201 Calculus of ureter: Secondary | ICD-10-CM | POA: Diagnosis not present

## 2021-04-21 DIAGNOSIS — Z880 Allergy status to penicillin: Secondary | ICD-10-CM | POA: Diagnosis not present

## 2021-04-21 DIAGNOSIS — K429 Umbilical hernia without obstruction or gangrene: Secondary | ICD-10-CM | POA: Diagnosis not present

## 2021-04-21 DIAGNOSIS — N132 Hydronephrosis with renal and ureteral calculous obstruction: Secondary | ICD-10-CM | POA: Diagnosis not present

## 2021-04-21 DIAGNOSIS — K769 Liver disease, unspecified: Secondary | ICD-10-CM | POA: Diagnosis not present

## 2021-04-21 DIAGNOSIS — Z79899 Other long term (current) drug therapy: Secondary | ICD-10-CM | POA: Diagnosis not present

## 2021-04-21 DIAGNOSIS — Z87442 Personal history of urinary calculi: Secondary | ICD-10-CM | POA: Diagnosis not present

## 2021-04-21 DIAGNOSIS — R1032 Left lower quadrant pain: Secondary | ICD-10-CM | POA: Diagnosis not present

## 2021-11-01 DIAGNOSIS — Z13220 Encounter for screening for lipoid disorders: Secondary | ICD-10-CM | POA: Diagnosis not present

## 2021-11-01 DIAGNOSIS — N400 Enlarged prostate without lower urinary tract symptoms: Secondary | ICD-10-CM | POA: Diagnosis not present

## 2021-11-01 DIAGNOSIS — Z125 Encounter for screening for malignant neoplasm of prostate: Secondary | ICD-10-CM | POA: Diagnosis not present

## 2021-11-01 DIAGNOSIS — Z0000 Encounter for general adult medical examination without abnormal findings: Secondary | ICD-10-CM | POA: Diagnosis not present

## 2021-11-01 DIAGNOSIS — Z23 Encounter for immunization: Secondary | ICD-10-CM | POA: Diagnosis not present

## 2021-11-01 DIAGNOSIS — R7303 Prediabetes: Secondary | ICD-10-CM | POA: Diagnosis not present

## 2022-03-12 DIAGNOSIS — M47812 Spondylosis without myelopathy or radiculopathy, cervical region: Secondary | ICD-10-CM | POA: Diagnosis not present

## 2022-06-10 DIAGNOSIS — G4733 Obstructive sleep apnea (adult) (pediatric): Secondary | ICD-10-CM | POA: Diagnosis not present

## 2022-10-15 DIAGNOSIS — M47812 Spondylosis without myelopathy or radiculopathy, cervical region: Secondary | ICD-10-CM | POA: Diagnosis not present

## 2022-12-18 DIAGNOSIS — Z13220 Encounter for screening for lipoid disorders: Secondary | ICD-10-CM | POA: Diagnosis not present

## 2022-12-18 DIAGNOSIS — Z23 Encounter for immunization: Secondary | ICD-10-CM | POA: Diagnosis not present

## 2022-12-18 DIAGNOSIS — R7303 Prediabetes: Secondary | ICD-10-CM | POA: Diagnosis not present

## 2022-12-18 DIAGNOSIS — N400 Enlarged prostate without lower urinary tract symptoms: Secondary | ICD-10-CM | POA: Diagnosis not present

## 2022-12-18 DIAGNOSIS — Z125 Encounter for screening for malignant neoplasm of prostate: Secondary | ICD-10-CM | POA: Diagnosis not present

## 2022-12-18 DIAGNOSIS — Z0000 Encounter for general adult medical examination without abnormal findings: Secondary | ICD-10-CM | POA: Diagnosis not present

## 2022-12-18 DIAGNOSIS — N200 Calculus of kidney: Secondary | ICD-10-CM | POA: Diagnosis not present

## 2022-12-18 DIAGNOSIS — F419 Anxiety disorder, unspecified: Secondary | ICD-10-CM | POA: Diagnosis not present

## 2023-03-18 DIAGNOSIS — R509 Fever, unspecified: Secondary | ICD-10-CM | POA: Diagnosis not present

## 2023-03-18 DIAGNOSIS — B349 Viral infection, unspecified: Secondary | ICD-10-CM | POA: Diagnosis not present

## 2023-04-16 DIAGNOSIS — R7303 Prediabetes: Secondary | ICD-10-CM | POA: Diagnosis not present

## 2023-06-29 DIAGNOSIS — J069 Acute upper respiratory infection, unspecified: Secondary | ICD-10-CM | POA: Diagnosis not present

## 2023-06-29 DIAGNOSIS — J209 Acute bronchitis, unspecified: Secondary | ICD-10-CM | POA: Diagnosis not present

## 2023-07-01 DIAGNOSIS — G4733 Obstructive sleep apnea (adult) (pediatric): Secondary | ICD-10-CM | POA: Diagnosis not present

## 2023-07-31 DIAGNOSIS — G4733 Obstructive sleep apnea (adult) (pediatric): Secondary | ICD-10-CM | POA: Diagnosis not present

## 2023-08-31 DIAGNOSIS — G4733 Obstructive sleep apnea (adult) (pediatric): Secondary | ICD-10-CM | POA: Diagnosis not present

## 2023-12-02 DIAGNOSIS — R053 Chronic cough: Secondary | ICD-10-CM | POA: Diagnosis not present

## 2023-12-31 DIAGNOSIS — Z23 Encounter for immunization: Secondary | ICD-10-CM | POA: Diagnosis not present

## 2023-12-31 DIAGNOSIS — N400 Enlarged prostate without lower urinary tract symptoms: Secondary | ICD-10-CM | POA: Diagnosis not present

## 2023-12-31 DIAGNOSIS — Z1331 Encounter for screening for depression: Secondary | ICD-10-CM | POA: Diagnosis not present

## 2023-12-31 DIAGNOSIS — K219 Gastro-esophageal reflux disease without esophagitis: Secondary | ICD-10-CM | POA: Diagnosis not present

## 2023-12-31 DIAGNOSIS — Z0000 Encounter for general adult medical examination without abnormal findings: Secondary | ICD-10-CM | POA: Diagnosis not present

## 2023-12-31 DIAGNOSIS — G4733 Obstructive sleep apnea (adult) (pediatric): Secondary | ICD-10-CM | POA: Diagnosis not present

## 2023-12-31 DIAGNOSIS — R7303 Prediabetes: Secondary | ICD-10-CM | POA: Diagnosis not present

## 2024-01-08 DIAGNOSIS — R1310 Dysphagia, unspecified: Secondary | ICD-10-CM | POA: Diagnosis not present

## 2024-01-08 DIAGNOSIS — K219 Gastro-esophageal reflux disease without esophagitis: Secondary | ICD-10-CM | POA: Diagnosis not present

## 2024-01-08 DIAGNOSIS — R195 Other fecal abnormalities: Secondary | ICD-10-CM | POA: Diagnosis not present
